# Patient Record
Sex: Female | Born: 1975 | Race: Black or African American | Hispanic: No | Smoking: Never smoker
Health system: Southern US, Community
[De-identification: ages and names within clinical notes are randomized; demographics above are authoritative.]

## PROBLEM LIST (undated history)

## (undated) DIAGNOSIS — K219 Gastro-esophageal reflux disease without esophagitis: Secondary | ICD-10-CM

## (undated) DIAGNOSIS — I10 Essential (primary) hypertension: Secondary | ICD-10-CM

## (undated) HISTORY — DX: Essential (primary) hypertension: I10

## (undated) HISTORY — PX: BACK SURGERY: SHX140

---

## 2009-02-08 HISTORY — PX: BREAST SURGERY: SHX581

## 2011-02-09 NOTE — L&D Delivery Note (Signed)
Arterial Cord Gas = 6.97

## 2011-07-17 ENCOUNTER — Inpatient Hospital Stay (HOSPITAL_COMMUNITY): Payer: Medicaid Other

## 2011-07-17 ENCOUNTER — Encounter (HOSPITAL_COMMUNITY): Payer: Self-pay | Admitting: *Deleted

## 2011-07-17 ENCOUNTER — Inpatient Hospital Stay (HOSPITAL_COMMUNITY)
Admission: AD | Admit: 2011-07-17 | Discharge: 2011-07-21 | DRG: 765 | Disposition: A | Discharge status: Home / Self Care | Payer: Medicaid Other | Source: Ambulatory Visit | Attending: Obstetrics & Gynecology | Admitting: Obstetrics & Gynecology

## 2011-07-17 DIAGNOSIS — IMO0002 Reserved for concepts with insufficient information to code with codable children: Secondary | ICD-10-CM

## 2011-07-17 DIAGNOSIS — O4290 Premature rupture of membranes, unspecified as to length of time between rupture and onset of labor, unspecified weeks of gestation: Secondary | ICD-10-CM | POA: Diagnosis present

## 2011-07-17 DIAGNOSIS — O42919 Preterm premature rupture of membranes, unspecified as to length of time between rupture and onset of labor, unspecified trimester: Secondary | ICD-10-CM | POA: Diagnosis present

## 2011-07-17 DIAGNOSIS — Z98891 History of uterine scar from previous surgery: Secondary | ICD-10-CM

## 2011-07-17 DIAGNOSIS — O321XX0 Maternal care for breech presentation, not applicable or unspecified: Secondary | ICD-10-CM | POA: Diagnosis present

## 2011-07-17 DIAGNOSIS — O09529 Supervision of elderly multigravida, unspecified trimester: Secondary | ICD-10-CM | POA: Diagnosis present

## 2011-07-17 DIAGNOSIS — O10019 Pre-existing essential hypertension complicating pregnancy, unspecified trimester: Secondary | ICD-10-CM | POA: Diagnosis present

## 2011-07-17 HISTORY — DX: Gastro-esophageal reflux disease without esophagitis: K21.9

## 2011-07-17 LAB — DIFFERENTIAL
Band Neutrophils: 4 % (ref 0–10)
Basophils Absolute: 0 10*3/uL (ref 0.0–0.1)
Basophils Relative: 0 % (ref 0–1)
Eosinophils Absolute: 0 10*3/uL (ref 0.0–0.7)
Eosinophils Relative: 0 % (ref 0–5)
Metamyelocytes Relative: 0 %
Myelocytes: 0 %
Neutro Abs: 14 10*3/uL — ABNORMAL HIGH (ref 1.7–7.7)
Promyelocytes Absolute: 0 %

## 2011-07-17 LAB — COMPREHENSIVE METABOLIC PANEL
Alkaline Phosphatase: 61 U/L (ref 39–117)
BUN: 5 mg/dL — ABNORMAL LOW (ref 6–23)
CO2: 22 mEq/L (ref 19–32)
Calcium: 8.7 mg/dL (ref 8.4–10.5)
GFR calc Af Amer: >90 mL/min (ref >90)
GFR calc non Af Amer: >90 mL/min (ref >90)
Glucose, Bld: 111 mg/dL — ABNORMAL HIGH (ref 70–99)
Total Protein: 6.8 g/dL (ref 6.0–8.3)

## 2011-07-17 LAB — TYPE AND SCREEN: Antibody Screen: NEGATIVE

## 2011-07-17 LAB — RPR: RPR Ser Ql: NONREACTIVE

## 2011-07-17 LAB — HEMOGLOBIN A1C
Hgb A1c MFr Bld: 5.9 % — ABNORMAL HIGH (ref <5.7)
Mean Plasma Glucose: 123 mg/dL — ABNORMAL HIGH (ref <117)

## 2011-07-17 LAB — RAPID URINE DRUG SCREEN, HOSP PERFORMED
Amphetamines: NOT DETECTED
Opiates: NOT DETECTED

## 2011-07-17 LAB — URINALYSIS, ROUTINE W REFLEX MICROSCOPIC
Leukocytes, UA: NEGATIVE
Protein, ur: 30 mg/dL — AB
Specific Gravity, Urine: 1.015 (ref 1.005–1.030)

## 2011-07-17 LAB — CBC
HCT: 36.7 % (ref 36.0–46.0)
Hemoglobin: 12.4 g/dL (ref 12.0–15.0)
MCH: 30.8 pg (ref 26.0–34.0)
MCHC: 33.8 g/dL (ref 30.0–36.0)
MCV: 91.3 fL (ref 78.0–100.0)
RBC: 4.02 MIL/uL (ref 3.87–5.11)

## 2011-07-17 LAB — RUBELLA SCREEN: Rubella: 158.9 IU/mL — ABNORMAL HIGH

## 2011-07-17 LAB — PROTEIN / CREATININE RATIO, URINE
Creatinine, Urine: 98.01 mg/dL
Protein Creatinine Ratio: 0.49 — ABNORMAL HIGH (ref 0.00–0.15)
Total Protein, Urine: 48.1 mg/dL

## 2011-07-17 LAB — WET PREP, GENITAL: Trich, Wet Prep: NONE SEEN

## 2011-07-17 LAB — HEPATITIS B SURFACE ANTIGEN: Hepatitis B Surface Ag: NEGATIVE

## 2011-07-17 LAB — OB RESULTS CONSOLE HIV ANTIBODY (ROUTINE TESTING): HIV: NONREACTIVE

## 2011-07-17 MED ORDER — FLUCONAZOLE 150 MG PO TABS
150.0000 mg | ORAL_TABLET | Freq: Once | ORAL | Status: AC
  Administered 2011-07-17: 150 mg via ORAL
  Filled 2011-07-17: qty 1

## 2011-07-17 MED ORDER — MAGNESIUM SULFATE BOLUS VIA INFUSION
6.0000 g | Freq: Once | INTRAVENOUS | Status: AC
  Administered 2011-07-17: 6 g via INTRAVENOUS
  Filled 2011-07-17: qty 500

## 2011-07-17 MED ORDER — ZOLPIDEM TARTRATE 10 MG PO TABS
10.0000 mg | ORAL_TABLET | Freq: Every evening | ORAL | Status: DC | PRN
  Administered 2011-07-18: 10 mg via ORAL
  Filled 2011-07-17: qty 1

## 2011-07-17 MED ORDER — BETAMETHASONE SOD PHOS & ACET 6 (3-3) MG/ML IJ SUSP
12.0000 mg | Freq: Every day | INTRAMUSCULAR | Status: DC
  Administered 2011-07-17: 12 mg via INTRAMUSCULAR
  Filled 2011-07-17 (×2): qty 2

## 2011-07-17 MED ORDER — DOCUSATE SODIUM 100 MG PO CAPS
100.0000 mg | ORAL_CAPSULE | Freq: Every day | ORAL | Status: DC
  Filled 2011-07-17 (×2): qty 1

## 2011-07-17 MED ORDER — LABETALOL HCL 5 MG/ML IV SOLN
20.0000 mg | INTRAVENOUS | Status: DC | PRN
  Filled 2011-07-17: qty 4

## 2011-07-17 MED ORDER — ACETAMINOPHEN 325 MG PO TABS: 650.0000 mg | ORAL_TABLET | ORAL | Status: DC | PRN

## 2011-07-17 MED ORDER — MAGNESIUM SULFATE 40 G IN LACTATED RINGERS - SIMPLE
3.0000 g/h | INTRAVENOUS | Status: DC
  Administered 2011-07-17: 2 g/h via INTRAVENOUS
  Administered 2011-07-18: 3 g/h via INTRAVENOUS
  Filled 2011-07-17 (×2): qty 500

## 2011-07-17 MED ORDER — CALCIUM CARBONATE ANTACID 500 MG PO CHEW
2.0000 | CHEWABLE_TABLET | ORAL | Status: DC | PRN
  Administered 2011-07-17: 200 mg via ORAL
  Filled 2011-07-17: qty 1
  Filled 2011-07-17: qty 2

## 2011-07-17 MED ORDER — PRENATAL MULTIVITAMIN CH
1.0000 | ORAL_TABLET | Freq: Every day | ORAL | Status: DC
  Filled 2011-07-17 (×2): qty 1

## 2011-07-17 MED ORDER — LABETALOL HCL 5 MG/ML IV SOLN
20.0000 mg | INTRAVENOUS | Status: DC | PRN
  Administered 2011-07-17: 20 mg via INTRAVENOUS
  Filled 2011-07-17: qty 4

## 2011-07-17 MED ORDER — LACTATED RINGERS IV SOLN
INTRAVENOUS | Status: DC
  Administered 2011-07-17 – 2011-07-18 (×3): via INTRAVENOUS

## 2011-07-17 MED ORDER — SODIUM CHLORIDE 0.9 % IV SOLN
2.0000 g | Freq: Four times a day (QID) | INTRAVENOUS | Status: DC
  Administered 2011-07-17 – 2011-07-18 (×4): 2 g via INTRAVENOUS
  Filled 2011-07-17 (×5): qty 2000

## 2011-07-17 MED ORDER — SODIUM CHLORIDE 0.9 % IV SOLN
250.0000 mg | Freq: Four times a day (QID) | INTRAVENOUS | Status: DC
  Administered 2011-07-17 – 2011-07-18 (×3): 250 mg via INTRAVENOUS
  Filled 2011-07-17 (×5): qty 250

## 2011-07-17 NOTE — Progress Notes (Signed)
BPP is 6/10 (-2 for fluid, -2 for breathing).  AFI 3.3. NST with baseline in 140s, moderate variablity, 10 x 10 accels, some variable decels. Reassuring for now.  Will repeat BPP as indicated.  Continue current management.  Jaynie Collins, M.D. 07/17/2011 5:02 PM

## 2011-07-17 NOTE — Progress Notes (Signed)
Pt gave me copy of prenatal labs from Mar-April 2013 done in [place].  Notified K. Clelia Croft, CNM of this

## 2011-07-17 NOTE — Progress Notes (Signed)
Spoke c Katie, Charity fundraiser (charge in NICU)  Requested Consult for sometime today.  Neonatologist is in delivery, but she will notify her.

## 2011-07-17 NOTE — MAU Note (Signed)
Water broke at 9am. Pt reports she had high blood pressure at the beginning and was on blood pressure medication  For one month and they told her to stop taking it because her b/p was normal again.

## 2011-07-17 NOTE — Progress Notes (Signed)
MgSO4 bolus completed pt tolerated well

## 2011-07-17 NOTE — H&P (Signed)
ANTENATAL ADMISSION HISTORY AND PHYSICAL  History of Present Illness: Pamela Salinas is a 36 y.o. G3P0020 at [redacted] weeks GA as per patient with unknown EDD admitted for PPROM and elevated BP.  Prenatal care in [place], arrived in the [place] end of April.  Encounter done with the help of a [place] interpreter.  Patient had gross rupture of membranes around 0900; copious amount of clear fluid. Denies bleeding, contractions.  Reports good fetal movement. Denies any fevers or recent infections, no abnormal vaginal discharge. Had intake visit on 07/16/11 [facility].  Patient is grossly ruptured on external evaluation.    Reports having elevated BP around February, was placed on medicine for a month, then discontinued because her BP normalized.  No history of chronic hypertension or other medical problems.  No other complications during this pregnancy.  Denies any headaches, vision changes, RUQ pain or other symptoms. In  Triage, BP was 200/107, then 180-190s/90s, and she received one dose of Labetalol.  BP now 134/81.    Patient Active Problem List  Diagnoses  . Hypertension in pregnancy, essential, antepartum  . Preterm premature rupture of membranes (PPROM) delivered, current hospitalization   Past Medical History: Past Medical History  Diagnosis Date  . GERD (gastroesophageal reflux disease)    Past Surgical History: Past Surgical History  Procedure Date  . Back surgery     cyst removal?   Obstetrical/Gynecologic History: OB History    Grav Para Term Preterm Abortions TAB SAB Ect Mult Living   3 0   2     0    SAB x 2, denies cervical dysplasia or STIs  Social History: History   Social History  . Marital Status: Unknown    Spouse Name: N/A    Number of Children: N/A  . Years of Education: N/A   Social History Main Topics  . Smoking status: Never Smoker   . Smokeless tobacco: Not on file  . Alcohol Use: No  . Drug Use: No  . Sexually Active: Yes    Birth Control/ Protection:  None   Other Topics Concern  . Not on file   Social History Narrative  . No narrative on file   Family History: No family history on file.  Allergies: No Known Allergies  Medications:  No prescriptions prior to admission   Review of Systems: Mentioned in HPI  Vitals:  Blood pressure 134/81, pulse 105, temperature 98 F (36.7 C), temperature source Oral, resp. rate 18. Physical Examination: General appearance - alert, well appearing, and in no distress Chest - clear to auscultation, no wheezes, rales or rhonchi, symmetric air entry Heart - tachycardic, regular rhythm Abdomen: gravid and non-tender  Pelvic Exam: Grossly ruptured, about 100 ml of clear fluid in vagina, cervix FT/75/-2 with bulging lower uterine segment.  Breech presentation on bedside ultrasound. Extremities: extremities normal, atraumatic, no cyanosis or edema and Homans sign is negative, no sign of DVT with DTRs 2+ on the bilateral and one beat of clonus Membranes:intact, ruptured, clear fluid Fetal Monitoring:Baseline: 140 bpm, Variability: moderate, Accelerations: Non-reactive but appropriate for gestational age and Decelerations: Variable: moderate Labs:  No results found for this or any previous visit (from the past 24 hour(s)).  Laboratory Studies: Results for orders placed during the hospital encounter of 07/17/11 (from the past 24 hour(s))  COMPREHENSIVE METABOLIC PANEL     Status: Abnormal   Collection Time   07/17/11 11:56 AM      Component Value Range   Sodium 136  135 -  145 (mEq/L)   Potassium 3.0 (*) 3.5 - 5.1 (mEq/L)   Chloride 103  96 - 112 (mEq/L)   CO2 22  19 - 32 (mEq/L)   Glucose, Bld 111 (*) 70 - 99 (mg/dL)   BUN 5 (*) 6 - 23 (mg/dL)   Creatinine, Ser 7.25 (*) 0.50 - 1.10 (mg/dL)   Calcium 8.7  8.4 - 36.6 (mg/dL)   Total Protein 6.8  6.0 - 8.3 (g/dL)   Albumin 2.9 (*) 3.5 - 5.2 (g/dL)   AST 22  0 - 37 (U/L)   ALT 5  0 - 35 (U/L)   Alkaline Phosphatase 61  39 - 117 (U/L)   Total  Bilirubin 0.2 (*) 0.3 - 1.2 (mg/dL)   GFR calc non Af Amer >90  >90 (mL/min)   GFR calc Af Amer >90  >90 (mL/min)  RAPID HIV SCREEN [facility])     Status: Normal   Collection Time   07/17/11 11:56 AM      Component Value Range   SUDS Rapid HIV Screen NON REACTIVE  NON REACTIVE   TYPE AND SCREEN     Status: Normal   Collection Time   07/17/11 11:56 AM      Component Value Range   ABO/RH(D) B POS     Antibody Screen NEG     Sample Expiration 07/20/2011    CBC     Status: Abnormal   Collection Time   07/17/11 11:56 AM      Component Value Range   WBC 17.3 (*) 4.0 - 10.5 (K/uL)   RBC 4.02  3.87 - 5.11 (MIL/uL)   Hemoglobin 12.4  12.0 - 15.0 (g/dL)   HCT 44.0  34.7 - 42.5 (%)   MCV 91.3  78.0 - 100.0 (fL)   MCH 30.8  26.0 - 34.0 (pg)   MCHC 33.8  30.0 - 36.0 (g/dL)   RDW 95.6  38.7 - 56.4 (%)   Platelets 313  150 - 400 (K/uL)  DIFFERENTIAL     Status: Abnormal   Collection Time   07/17/11 11:56 AM      Component Value Range   Neutrophils Relative 77  43 - 77 (%)   Lymphocytes Relative 14  12 - 46 (%)   Monocytes Relative 5  3 - 12 (%)   Eosinophils Relative 0  0 - 5 (%)   Basophils Relative 0  0 - 1 (%)   Band Neutrophils 4  0 - 10 (%)   Metamyelocytes Relative 0     Myelocytes 0     Promyelocytes Absolute 0     Blasts 0     nRBC 0  0 (/100 WBC)   Neutro Abs 14.0 (*) 1.7 - 7.7 (K/uL)   Lymphs Abs 2.4  0.7 - 4.0 (K/uL)   Monocytes Absolute 0.9  0.1 - 1.0 (K/uL)   Eosinophils Absolute 0.0  0.0 - 0.7 (K/uL)   Basophils Absolute 0.0  0.0 - 0.1 (K/uL)   Smear Review LARGE PLATELETS PRESENT    URINALYSIS, ROUTINE W REFLEX MICROSCOPIC     Status: Abnormal   Collection Time   07/17/11 12:15 PM      Component Value Range   Color, Urine YELLOW  YELLOW    APPearance CLEAR  CLEAR    Specific Gravity, Urine 1.015  1.005 - 1.030    pH 7.0  5.0 - 8.0    Glucose, UA NEGATIVE  NEGATIVE (mg/dL)   Hgb urine dipstick LARGE (*) NEGATIVE  Bilirubin Urine NEGATIVE  NEGATIVE    Ketones, ur 15  (*) NEGATIVE (mg/dL)   Protein, ur 30 (*) NEGATIVE (mg/dL)   Urobilinogen, UA 0.2  0.0 - 1.0 (mg/dL)   Nitrite NEGATIVE  NEGATIVE    Leukocytes, UA NEGATIVE  NEGATIVE   URINE MICROSCOPIC-ADD ON     Status: Normal   Collection Time   07/17/11 12:15 PM      Component Value Range   Squamous Epithelial / LPF RARE  RARE    WBC, UA 0-2  <3 (WBC/hpf)   RBC / HPF 7-10  <3 (RBC/hpf)   Bacteria, UA RARE  RARE   WET PREP, GENITAL     Status: Abnormal   Collection Time   07/17/11 12:18 PM      Component Value Range   Yeast Wet Prep HPF POC MODERATE (*) NONE SEEN    Trich, Wet Prep NONE SEEN  NONE SEEN    Clue Cells Wet Prep HPF POC NONE SEEN  NONE SEEN    WBC, Wet Prep HPF POC FEW (*) NONE SEEN     ASSESSMENT AND PLAN: Patient Active Problem List  Diagnoses  . Hypertension in pregnancy, essential, antepartum  . Preterm premature rupture of membranes (PPROM) delivered, current hospitalization  Will admit to Antenatal Magnesium sulfate for neuroprotection and eclampsia prophylaxis Betamethasone regimen.  Will consider doing 1 hr GTT in 1-2 weeks if still pregnant. Antihypertensives as needed, will follow up 24 hour urine protein analysis and other labs. OB ultrasound ordered NICU and Anesthesiology consults ordered Bedrest with bathroom privileges; SCDs while in bed NST BID, tocometry as needed Latency antibiotics ordered; also Diflucan given for yeast infection. Delivery indicated for uncontrolled BP, fetal distress, any signs of chorioamnionitis or other maternal-fetal indication Routine antenatal care  Jaynie Collins, M.D. 07/17/2011 1:56 PM

## 2011-07-17 NOTE — Progress Notes (Signed)
US tech here for BPP

## 2011-07-17 NOTE — Progress Notes (Signed)
Reviewing Admission Data & POC c pt, using [employer] [place]).  Interpreter # [id number].

## 2011-07-18 ENCOUNTER — Inpatient Hospital Stay (HOSPITAL_COMMUNITY): Payer: Medicaid Other | Admitting: Anesthesiology

## 2011-07-18 ENCOUNTER — Encounter (HOSPITAL_COMMUNITY): Payer: Self-pay | Admitting: Anesthesiology

## 2011-07-18 ENCOUNTER — Encounter (HOSPITAL_COMMUNITY): Payer: Self-pay

## 2011-07-18 ENCOUNTER — Inpatient Hospital Stay (HOSPITAL_COMMUNITY): Payer: Medicaid Other

## 2011-07-18 ENCOUNTER — Encounter (HOSPITAL_COMMUNITY)
Admission: AD | Disposition: A | Discharge status: Home / Self Care | Payer: Self-pay | Source: Ambulatory Visit | Attending: Obstetrics & Gynecology

## 2011-07-18 DIAGNOSIS — Z98891 History of uterine scar from previous surgery: Secondary | ICD-10-CM

## 2011-07-18 LAB — MRSA PCR SCREENING: MRSA by PCR: NEGATIVE

## 2011-07-18 SURGERY — Surgical Case
Anesthesia: Regional | Site: Abdomen | Wound class: Clean Contaminated

## 2011-07-18 MED ORDER — SODIUM CHLORIDE 0.9 % IV SOLN
1.0000 ug/kg/h | INTRAVENOUS | Status: DC | PRN
  Filled 2011-07-18: qty 2.5

## 2011-07-18 MED ORDER — SIMETHICONE 80 MG PO CHEW
80.0000 mg | CHEWABLE_TABLET | ORAL | Status: DC | PRN
  Administered 2011-07-18 – 2011-07-19 (×2): 80 mg via ORAL

## 2011-07-18 MED ORDER — DIPHENHYDRAMINE HCL 50 MG/ML IJ SOLN: 12.5000 mg | INTRAMUSCULAR | Status: DC | PRN

## 2011-07-18 MED ORDER — PROMETHAZINE HCL 25 MG/ML IJ SOLN: 6.2500 mg | INTRAMUSCULAR | Status: DC | PRN

## 2011-07-18 MED ORDER — KETOROLAC TROMETHAMINE 30 MG/ML IJ SOLN: 30.0000 mg | Freq: Four times a day (QID) | INTRAMUSCULAR | Status: DC | PRN

## 2011-07-18 MED ORDER — DIPHENHYDRAMINE HCL 25 MG PO CAPS: 25.0000 mg | ORAL_CAPSULE | ORAL | Status: DC | PRN

## 2011-07-18 MED ORDER — ACETAMINOPHEN 325 MG PO TABS: 325.0000 mg | ORAL_TABLET | ORAL | Status: DC | PRN

## 2011-07-18 MED ORDER — MEPERIDINE HCL 25 MG/ML IJ SOLN: 6.2500 mg | INTRAMUSCULAR | Status: DC | PRN

## 2011-07-18 MED ORDER — NALBUPHINE HCL 10 MG/ML IJ SOLN
5.0000 mg | INTRAMUSCULAR | Status: DC | PRN
  Filled 2011-07-18: qty 1

## 2011-07-18 MED ORDER — ONDANSETRON HCL 4 MG PO TABS: 4.0000 mg | ORAL_TABLET | ORAL | Status: DC | PRN

## 2011-07-18 MED ORDER — FENTANYL CITRATE 0.05 MG/ML IJ SOLN: 25.0000 ug | INTRAMUSCULAR | Status: DC | PRN

## 2011-07-18 MED ORDER — ACETAMINOPHEN 10 MG/ML IV SOLN
1000.0000 mg | Freq: Four times a day (QID) | INTRAVENOUS | Status: DC | PRN
  Filled 2011-07-18: qty 100

## 2011-07-18 MED ORDER — ONDANSETRON HCL 4 MG/2ML IJ SOLN: 4.0000 mg | Freq: Three times a day (TID) | INTRAMUSCULAR | Status: DC | PRN

## 2011-07-18 MED ORDER — SCOPOLAMINE 1 MG/3DAYS TD PT72
1.0000 | MEDICATED_PATCH | Freq: Once | TRANSDERMAL | Status: AC
  Administered 2011-07-18: 1.5 mg via TRANSDERMAL

## 2011-07-18 MED ORDER — SODIUM CHLORIDE 0.9 % IV SOLN: 250.0000 mL | INTRAVENOUS | Status: DC

## 2011-07-18 MED ORDER — TETANUS-DIPHTH-ACELL PERTUSSIS 5-2.5-18.5 LF-MCG/0.5 IM SUSP
0.5000 mL | Freq: Once | INTRAMUSCULAR | Status: AC
  Administered 2011-07-19: 0.5 mL via INTRAMUSCULAR
  Filled 2011-07-18: qty 0.5

## 2011-07-18 MED ORDER — IBUPROFEN 600 MG PO TABS
600.0000 mg | ORAL_TABLET | Freq: Four times a day (QID) | ORAL | Status: DC
  Administered 2011-07-18 – 2011-07-21 (×10): 600 mg via ORAL
  Filled 2011-07-18 (×11): qty 1

## 2011-07-18 MED ORDER — SODIUM CHLORIDE 0.9 % IJ SOLN: 3.0000 mL | INTRAMUSCULAR | Status: DC | PRN

## 2011-07-18 MED ORDER — MORPHINE SULFATE 0.5 MG/ML IJ SOLN
INTRAMUSCULAR | Status: AC
  Filled 2011-07-18: qty 10

## 2011-07-18 MED ORDER — LANOLIN HYDROUS EX OINT: 1.0000 "application " | TOPICAL_OINTMENT | CUTANEOUS | Status: DC | PRN

## 2011-07-18 MED ORDER — PHENYLEPHRINE 40 MCG/ML (10ML) SYRINGE FOR IV PUSH (FOR BLOOD PRESSURE SUPPORT)
PREFILLED_SYRINGE | INTRAVENOUS | Status: AC
  Filled 2011-07-18: qty 20

## 2011-07-18 MED ORDER — MORPHINE SULFATE (PF) 0.5 MG/ML IJ SOLN
INTRAMUSCULAR | Status: DC | PRN
  Administered 2011-07-18: .1 mg via INTRATHECAL

## 2011-07-18 MED ORDER — MIDAZOLAM HCL 2 MG/2ML IJ SOLN: 0.5000 mg | Freq: Once | INTRAMUSCULAR | Status: DC | PRN

## 2011-07-18 MED ORDER — OXYTOCIN 10 UNIT/ML IJ SOLN
INTRAMUSCULAR | Status: AC
  Filled 2011-07-18: qty 4

## 2011-07-18 MED ORDER — SODIUM CHLORIDE 0.9 % IR SOLN
Status: DC | PRN
  Administered 2011-07-18: 1000 mL

## 2011-07-18 MED ORDER — OXYCODONE-ACETAMINOPHEN 5-325 MG PO TABS
1.0000 | ORAL_TABLET | ORAL | Status: DC | PRN
  Administered 2011-07-18: 2 via ORAL
  Administered 2011-07-19 – 2011-07-21 (×7): 1 via ORAL
  Filled 2011-07-18: qty 1
  Filled 2011-07-18: qty 2
  Filled 2011-07-18 (×6): qty 1

## 2011-07-18 MED ORDER — EPHEDRINE 5 MG/ML INJ
INTRAVENOUS | Status: AC
  Filled 2011-07-18: qty 10

## 2011-07-18 MED ORDER — MENTHOL 3 MG MT LOZG: 1.0000 | LOZENGE | OROMUCOSAL | Status: DC | PRN

## 2011-07-18 MED ORDER — OXYTOCIN 20 UNITS IN LACTATED RINGERS INFUSION - SIMPLE
INTRAVENOUS | Status: DC | PRN
  Administered 2011-07-18 (×2): 20 [IU] via INTRAVENOUS

## 2011-07-18 MED ORDER — FENTANYL CITRATE 0.05 MG/ML IJ SOLN
INTRAMUSCULAR | Status: AC
  Filled 2011-07-18: qty 2

## 2011-07-18 MED ORDER — OXYCODONE-ACETAMINOPHEN 5-325 MG PO TABS
2.0000 | ORAL_TABLET | Freq: Once | ORAL | Status: AC
  Administered 2011-07-18: 2 via ORAL
  Filled 2011-07-18: qty 2

## 2011-07-18 MED ORDER — ONDANSETRON HCL 4 MG/2ML IJ SOLN
INTRAMUSCULAR | Status: DC | PRN
  Administered 2011-07-18: 4 mg via INTRAVENOUS

## 2011-07-18 MED ORDER — ONDANSETRON HCL 4 MG/2ML IJ SOLN
INTRAMUSCULAR | Status: AC
  Filled 2011-07-18: qty 2

## 2011-07-18 MED ORDER — DIBUCAINE 1 % RE OINT: 1.0000 "application " | TOPICAL_OINTMENT | RECTAL | Status: DC | PRN

## 2011-07-18 MED ORDER — SCOPOLAMINE 1 MG/3DAYS TD PT72
MEDICATED_PATCH | TRANSDERMAL | Status: AC
  Filled 2011-07-18: qty 1

## 2011-07-18 MED ORDER — KETOROLAC TROMETHAMINE 30 MG/ML IJ SOLN
INTRAMUSCULAR | Status: AC
  Administered 2011-07-18: 30 mg via INTRAVENOUS
  Filled 2011-07-18: qty 1

## 2011-07-18 MED ORDER — OXYTOCIN 20 UNITS IN LACTATED RINGERS INFUSION - SIMPLE
125.0000 mL/h | INTRAVENOUS | Status: AC
  Administered 2011-07-18: 100 mL/h via INTRAVENOUS
  Filled 2011-07-18 (×2): qty 1000

## 2011-07-18 MED ORDER — DIPHENHYDRAMINE HCL 50 MG/ML IJ SOLN: 25.0000 mg | INTRAMUSCULAR | Status: DC | PRN

## 2011-07-18 MED ORDER — EPHEDRINE SULFATE 50 MG/ML IJ SOLN
INTRAMUSCULAR | Status: DC | PRN
  Administered 2011-07-18 (×2): 10 mg via INTRAVENOUS
  Administered 2011-07-18: 5 mg via INTRAVENOUS

## 2011-07-18 MED ORDER — DIPHENHYDRAMINE HCL 25 MG PO CAPS: 25.0000 mg | ORAL_CAPSULE | Freq: Four times a day (QID) | ORAL | Status: DC | PRN

## 2011-07-18 MED ORDER — NALOXONE HCL 0.4 MG/ML IJ SOLN: 0.4000 mg | INTRAMUSCULAR | Status: DC | PRN

## 2011-07-18 MED ORDER — PRENATAL MULTIVITAMIN CH
1.0000 | ORAL_TABLET | Freq: Every day | ORAL | Status: DC
  Administered 2011-07-19 – 2011-07-21 (×3): 1 via ORAL
  Filled 2011-07-18 (×3): qty 1

## 2011-07-18 MED ORDER — METOCLOPRAMIDE HCL 5 MG/ML IJ SOLN: 10.0000 mg | Freq: Three times a day (TID) | INTRAMUSCULAR | Status: DC | PRN

## 2011-07-18 MED ORDER — PROPOFOL 10 MG/ML IV EMUL
INTRAVENOUS | Status: AC
  Filled 2011-07-18: qty 20

## 2011-07-18 MED ORDER — MAGNESIUM HYDROXIDE 400 MG/5ML PO SUSP
30.0000 mL | ORAL | Status: DC | PRN
  Filled 2011-07-18: qty 30

## 2011-07-18 MED ORDER — ZOLPIDEM TARTRATE 5 MG PO TABS: 5.0000 mg | ORAL_TABLET | Freq: Every evening | ORAL | Status: DC | PRN

## 2011-07-18 MED ORDER — CITRIC ACID-SODIUM CITRATE 334-500 MG/5ML PO SOLN
ORAL | Status: AC
  Administered 2011-07-18: 30 mL
  Filled 2011-07-18: qty 15

## 2011-07-18 MED ORDER — KETOROLAC TROMETHAMINE 30 MG/ML IJ SOLN
30.0000 mg | Freq: Four times a day (QID) | INTRAMUSCULAR | Status: DC | PRN
  Administered 2011-07-18: 30 mg via INTRAVENOUS

## 2011-07-18 MED ORDER — BUPIVACAINE IN DEXTROSE 0.75-8.25 % IT SOLN
INTRATHECAL | Status: DC | PRN
  Administered 2011-07-18: 12 mg via INTRATHECAL

## 2011-07-18 MED ORDER — FENTANYL CITRATE 0.05 MG/ML IJ SOLN
INTRAMUSCULAR | Status: DC | PRN
  Administered 2011-07-18: 25 ug via INTRATHECAL

## 2011-07-18 MED ORDER — ONDANSETRON HCL 4 MG/2ML IJ SOLN: 4.0000 mg | INTRAMUSCULAR | Status: DC | PRN

## 2011-07-18 MED ORDER — WITCH HAZEL-GLYCERIN EX PADS: 1.0000 "application " | MEDICATED_PAD | CUTANEOUS | Status: DC | PRN

## 2011-07-18 MED ORDER — PHENYLEPHRINE HCL 10 MG/ML IJ SOLN
INTRAMUSCULAR | Status: DC | PRN
  Administered 2011-07-18 (×5): 40 ug via INTRAVENOUS
  Administered 2011-07-18 (×2): 80 ug via INTRAVENOUS
  Administered 2011-07-18 (×2): 40 ug via INTRAVENOUS
  Administered 2011-07-18 (×2): 80 ug via INTRAVENOUS
  Administered 2011-07-18 (×2): 40 ug via INTRAVENOUS

## 2011-07-18 MED ORDER — SENNOSIDES-DOCUSATE SODIUM 8.6-50 MG PO TABS
2.0000 | ORAL_TABLET | Freq: Every day | ORAL | Status: DC
  Administered 2011-07-18 – 2011-07-20 (×3): 2 via ORAL

## 2011-07-18 MED ORDER — SODIUM CHLORIDE 0.9 % IJ SOLN
3.0000 mL | Freq: Two times a day (BID) | INTRAMUSCULAR | Status: DC
  Administered 2011-07-18 – 2011-07-19 (×2): 3 mL via INTRAVENOUS

## 2011-07-18 MED ORDER — MAGNESIUM SULFATE 40 G IN LACTATED RINGERS - SIMPLE
2.0000 g/h | INTRAVENOUS | Status: DC
  Administered 2011-07-18: 2 g/h via INTRAVENOUS
  Filled 2011-07-18: qty 500

## 2011-07-18 SURGICAL SUPPLY — 34 items
CHLORAPREP W/TINT 26ML (MISCELLANEOUS) IMPLANT
CLOTH BEACON ORANGE TIMEOUT ST (SAFETY) ×2 IMPLANT
DRESSING TELFA 8X3 (GAUZE/BANDAGES/DRESSINGS) IMPLANT
DRSG COVADERM 4X10 (GAUZE/BANDAGES/DRESSINGS) ×2 IMPLANT
ELECT REM PT RETURN 9FT ADLT (ELECTROSURGICAL) ×2
ELECTRODE REM PT RTRN 9FT ADLT (ELECTROSURGICAL) ×1 IMPLANT
EXTRACTOR VACUUM M CUP 4 TUBE (SUCTIONS) IMPLANT
GAUZE SPONGE 4X4 12PLY STRL LF (GAUZE/BANDAGES/DRESSINGS) IMPLANT
GLOVE BIO SURGEON STRL SZ7 (GLOVE) ×2 IMPLANT
GLOVE BIOGEL PI IND STRL 7.0 (GLOVE) ×2 IMPLANT
GLOVE BIOGEL PI INDICATOR 7.0 (GLOVE) ×2
GOWN PREVENTION PLUS LG XLONG (DISPOSABLE) ×6 IMPLANT
KIT ABG SYR 3ML LUER SLIP (SYRINGE) ×2 IMPLANT
NEEDLE HYPO 22GX1.5 SAFETY (NEEDLE) IMPLANT
NEEDLE HYPO 25X5/8 SAFETYGLIDE (NEEDLE) ×2 IMPLANT
NS IRRIG 1000ML POUR BTL (IV SOLUTION) ×2 IMPLANT
PACK C SECTION WH (CUSTOM PROCEDURE TRAY) ×2 IMPLANT
PAD ABD 7.5X8 STRL (GAUZE/BANDAGES/DRESSINGS) ×4 IMPLANT
RTRCTR C-SECT PINK 25CM LRG (MISCELLANEOUS) ×2 IMPLANT
SLEEVE SCD COMPRESS KNEE LRG (MISCELLANEOUS) IMPLANT
SLEEVE SCD COMPRESS KNEE MED (MISCELLANEOUS) ×2 IMPLANT
STAPLER VISISTAT 35W (STAPLE) IMPLANT
SUT MNCRL 0 VIOLET CTX 36 (SUTURE) ×2 IMPLANT
SUT MONOCRYL 0 CTX 36 (SUTURE) ×2
SUT PDS AB 0 CT1 27 (SUTURE) IMPLANT
SUT VIC AB 0 CT1 36 (SUTURE) ×4 IMPLANT
SUT VIC AB 2-0 CT1 27 (SUTURE)
SUT VIC AB 2-0 CT1 TAPERPNT 27 (SUTURE) IMPLANT
SUT VIC AB 4-0 KS 27 (SUTURE) ×2 IMPLANT
SYR CONTROL 10ML LL (SYRINGE) IMPLANT
TAPE CLOTH SURG 4X10 WHT LF (GAUZE/BANDAGES/DRESSINGS) ×2 IMPLANT
TOWEL OR 17X24 6PK STRL BLUE (TOWEL DISPOSABLE) ×4 IMPLANT
TRAY FOLEY CATH 14FR (SET/KITS/TRAYS/PACK) IMPLANT
WATER STERILE IRR 1000ML POUR (IV SOLUTION) ×2 IMPLANT

## 2011-07-18 NOTE — Progress Notes (Signed)
Dr. Macon Large notified of pt status, FHR tracing and interventions uc pattern, and request for medication for pain, orders received

## 2011-07-18 NOTE — Anesthesia Procedure Notes (Signed)
Spinal  Patient location during procedure: OR Start time: 07/18/2011 8:16 AM Staffing Anesthesiologist: Brayton Caves R Performed by: anesthesiologist  Preanesthetic Checklist Completed: patient identified, site marked, surgical consent, pre-op evaluation, timeout performed, IV checked, risks and benefits discussed and monitors and equipment checked Spinal Block Patient position: sitting Prep: DuraPrep Patient monitoring: heart rate, cardiac monitor, continuous pulse ox and blood pressure Approach: midline Location: L3-4 Injection technique: single-shot Needle Needle type: Sprotte  Needle gauge: 24 G Needle length: 9 cm Assessment Sensory level: T4 Additional Notes Risk benefits discussed with translator. Lateral placement.  No complications.

## 2011-07-18 NOTE — Progress Notes (Signed)
Pincus Badder CNM notified of FHR and decelerations. Will continue to monitor.

## 2011-07-18 NOTE — Transfer of Care (Signed)
Immediate Anesthesia Transfer of Care Note  Patient: Pamela Salinas  Procedure(s) Performed: Procedure(s) (LRB): CESAREAN SECTION (N/A)  Patient Location: PACU  Anesthesia Type: Spinal  Level of Consciousness: awake, alert  and oriented  Airway & Oxygen Therapy: Patient Spontanous Breathing  Post-op Assessment: Report given to PACU RN and Post -op Vital signs reviewed and stable  Post vital signs: stable  Complications: No apparent anesthesia complications

## 2011-07-18 NOTE — Progress Notes (Addendum)
LATE ENTRY for [id number]  -- SVE by Dr Macon Large @ [phone].  Found fetal legs & Umbilical Cord in vagina.  - Called for additional RN assistance, Notifed OR, NICU & Anesthesia.  [employer] ([place]) used to explain situation to pt & obtain consent.  Dr Macon Large maintained her position while prepped for OR, then switched with Sandrea Hughs, RNC so she could scrub.   Accompanied to OR by pt's cousin. 56 -- IN OR.  Report given to CRNA & Circulator.  Used [employer] for assistance during spinal & surgery. [id number]-  Pt care turned over to [employer], RN 0900-  24 urine taken to PACU for cont'd collection.

## 2011-07-18 NOTE — Transfer of Care (Signed)
Immediate Anesthesia Transfer of Care Note  Patient: Pamela Salinas  Procedure(s) Performed: * No procedures listed *  Patient Location: PACU  Anesthesia Type: Spinal  Level of Consciousness: awake, alert  and oriented  Airway & Oxygen Therapy: Patient Spontanous Breathing  Post-op Assessment: Report given to PACU RN and Post -op Vital signs reviewed and stable  Post vital signs: stable  Complications: No apparent anesthesia complications

## 2011-07-18 NOTE — Op Note (Signed)
[name] PROCEDURE DATE: 07/17/2011 - 07/18/2011  PREOPERATIVE DIAGNOSIS: Intrauterine pregnancy at  [redacted]w[redacted]d weeks gestation; preeclampsia; PPROM; breech presentation; cord prolapse  POSTOPERATIVE DIAGNOSIS: The same  PROCEDURE: Primary Low Transverse Cesarean Section  SURGEON:  Dr. Jaynie Collins  ANESTHESIOLOGIST: Dr. Brayton Caves  INDICATIONS: Pamela Salinas is a 36 y.o. [id number] at [redacted]w[redacted]d here for cesarean section secondary to cord prolapse in the setting of known PPROM and preeclampsia.  The risks of cesarean section were discussed with the patient including but were not limited to: bleeding which may require transfusion or reoperation; infection which may require antibiotics; injury to bowel, bladder, ureters or other surrounding organs; injury to the fetus; need for additional procedures including hysterectomy in the event of a life-threatening hemorrhage; placental abnormalities wth subsequent pregnancies, incisional problems, thromboembolic phenomenon and other postoperative/anesthesia complications.   The patient concurred with the proposed plan, giving informed written consent for the procedure.    FINDINGS:  Viable female infant in breech presentation.  Apgars 4 and 7, arterial cord pH 6.97 as per RN report, weight 2 pounds and 11.9 ounces.  Clear amniotic fluid.  Intact placenta, three vessel cord.  Normal uterus, fallopian tubes and ovaries bilaterally.  ANESTHESIA: Spinal INTRAVENOUS FLUIDS: 1300 ml ESTIMATED BLOOD LOSS: 400 ml URINE OUTPUT:  50 ml SPECIMENS: Placenta sent to pathology COMPLICATIONS: None immediate  PROCEDURE IN DETAIL:  The patient preoperatively received intravenous antibiotics and had sequential compression devices applied to her lower extremities.  She was then taken to the operating room where spinal anesthesia was administered and was found to be adequate. She was then placed in a dorsal supine position with a leftward tilt, and prepped and draped in a  sterile manner.  She already had a foley catheter placed into her bladder and attached to constant gravity.  After an adequate timeout was performed, a Pfannenstiel skin incision was made with scalpel and carried through to the underlying layer of fascia. The fascia was incised in the midline, and this incision was extended bilaterally bluntly.  The rectus muscles were dissected off bluntly and separated in the midline bluntly and the peritoneum was entered bluntly. Attention was turned to the lower uterine segment where a low transverse hysterotomy was made with a scalpel and extended bilaterally bluntly.  The infant was successfully delivered, the cord was clamped and cut and the infant was handed over to awaiting neonatology team. Uterine massage was then administered, and the placenta delivered intact with a three-vessel cord. The uterus was then cleared of clot and debris.  The hysterotomy was closed with 0 Monocryl in a running locked fashion, and an imbricating layer was also placed with a 0 Monocryl suture. The pelvis was cleared of all clot and debris. Hemostasis was confirmed on all surfaces.  The peritoneum and the muscles were reapproximated using 0 Monocryl interrupted stitches. The fascia was then closed using 0 Vicryl  in a running fashion.  The subcutaneous layer was irrigated, then reapproximated with 0 Vicryl interrupted stitches, and the skin was closed with a 4-0 Vicryl subcuticular stitch. The patient tolerated the procedure well. Sponge, lap, instrument and needle counts were correct x 2.  She was taken to the recovery room in stable condition.

## 2011-07-18 NOTE — OR Nursing (Signed)
Uterus massaged by S. Ra Pfiester Charity fundraiser. Two tubes of cord blood sent to lab. Foley catheter in upon arrival to OR. Urine color-yellow.

## 2011-07-18 NOTE — Interval H&P Note (Signed)
History and Physical Interval Note:  Patient diagnosed with umbilical cord prolapse on exam after having increasing contractions, fetal bottom and legs in the vagina. FHR in the 130s, minimal variability, variable decels. Presenting part lifted up off the cord, urgent cesarean section called.  Anesthesia and OR aware.  Risks of surgery reviewed with help of [facility] interpreter.  To OR now.  Jaynie Collins, M.D. 07/18/2011 8:02 AM

## 2011-07-18 NOTE — Anesthesia Preprocedure Evaluation (Signed)
Anesthesia Evaluation  Patient identified by MRN, date of birth, ID band Patient awake    Reviewed: Allergy & Precautions, H&P , NPO status , Patient's Chart, lab work & pertinent test results  Airway Mallampati: III      Dental No notable dental hx.    Pulmonary neg pulmonary ROS,  breath sounds clear to auscultation  Pulmonary exam normal       Cardiovascular Exercise Tolerance: Good hypertension, negative cardio ROS  Rhythm:regular Rate:Normal     Neuro/Psych negative neurological ROS  negative psych ROS   GI/Hepatic negative GI ROS, Neg liver ROS, GERD-  ,  Endo/Other  negative endocrine ROS  Renal/GU negative Renal ROS  negative genitourinary   Musculoskeletal   Abdominal Normal abdominal exam  (+)   Peds  Hematology negative hematology ROS (+)   Anesthesia Other Findings   Reproductive/Obstetrics (+) Pregnancy                           Anesthesia Physical Anesthesia Plan  ASA: III and Emergent  Anesthesia Plan: Spinal   Post-op Pain Management:    Induction:   Airway Management Planned:   Additional Equipment:   Intra-op Plan:   Post-operative Plan:   Informed Consent: I have reviewed the patients History and Physical, chart, labs and discussed the procedure including the risks, benefits and alternatives for the proposed anesthesia with the patient or authorized representative who has indicated his/her understanding and acceptance.     Plan Discussed with: Anesthesiologist, CRNA and Surgeon  Anesthesia Plan Comments:         Anesthesia Quick Evaluation

## 2011-07-18 NOTE — Progress Notes (Signed)
Dr. Arby Barrette in department notified of pt status an dconsult ordered

## 2011-07-18 NOTE — Anesthesia Postprocedure Evaluation (Signed)
Anesthesia Post Note  Patient: Pamela Salinas  Procedure(s) Performed: Procedure(s) (LRB): CESAREAN SECTION (N/A)  Anesthesia type: Spinal  Patient location: PACU  Post pain: Pain level controlled  Post assessment: Post-op Vital signs reviewed  Last Vitals:  Filed Vitals:   07/18/11 1015  BP: 130/77  Pulse: 107  Temp:   Resp: 24    Post vital signs: Reviewed  Level of consciousness: awake  Complications: No apparent anesthesia complications

## 2011-07-19 ENCOUNTER — Encounter (HOSPITAL_COMMUNITY): Payer: Self-pay | Admitting: *Deleted

## 2011-07-19 LAB — CBC
Platelets: 306 10*3/uL (ref 150–400)
RBC: 3.77 MIL/uL — ABNORMAL LOW (ref 3.87–5.11)
WBC: 19.1 10*3/uL — ABNORMAL HIGH (ref 4.0–10.5)

## 2011-07-19 MED ORDER — MAGNESIUM SULFATE 40 G IN LACTATED RINGERS - SIMPLE: 2.0000 g/h | INTRAVENOUS | Status: AC

## 2011-07-19 MED ORDER — LACTATED RINGERS IV SOLN
INTRAVENOUS | Status: DC
  Administered 2011-07-19: 100 mL/h via INTRAVENOUS

## 2011-07-19 NOTE — Progress Notes (Signed)
Fundus firm at U/2 with scant lochia.  Negative Homans sign. Encouraged ambulation and increased activity with assist. Hourly incentive spirometry also encouraged.

## 2011-07-19 NOTE — Progress Notes (Addendum)
Subjective: Postpartum Day 2: Cesarean Delivery Patient reports incisional pain and tolerating PO.    Objective: Vital signs in last 24 hours: Temp:  [97.6 F (36.4 C)-99.2 F (37.3 C)] 98.2 F (36.8 C) (06/10 0400) Pulse Rate:  [88-125] 94  (06/10 0700) Resp:  [16-34] 18  (06/10 0600) BP: (109-145)/(56-88) 126/68 mmHg (06/10 0700) SpO2:  [94 %-100 %] 97 % (06/10 0700) Weight:  [109.77 kg (242 lb)-112.22 kg (247 lb 6.4 oz)] 112.22 kg (247 lb 6.4 oz) (06/10 0600)  Physical Exam:  General: alert, cooperative and no distress Lochia: appropriate Uterine Fundus: firm Incision: healing well, dry dressing DVT Evaluation: No evidence of DVT seen on physical exam.   Basename 07/19/11 0508 07/17/11 1156  HGB 11.5* 12.4  HCT 34.9* 36.7   Recent Results (from the past 24 hour(s))  MRSA PCR SCREENING   Collection Time   07/18/11 11:20 AM      Component Value Range   MRSA by PCR NEGATIVE  NEGATIVE   CBC   Collection Time   07/19/11  5:08 AM      Component Value Range   WBC 19.1 (*) 4.0 - 10.5 (K/uL)   RBC 3.77 (*) 3.87 - 5.11 (MIL/uL)   Hemoglobin 11.5 (*) 12.0 - 15.0 (g/dL)   HCT 40.9 (*) 81.1 - 46.0 (%)   MCV 92.6  78.0 - 100.0 (fL)   MCH 30.5  26.0 - 34.0 (pg)   MCHC 33.0  30.0 - 36.0 (g/dL)   RDW 91.4  78.2 - 95.6 (%)   Platelets 306  150 - 400 (K/uL)     Assessment/Plan: Status post Cesarean section. Doing well postoperatively.  Continue current care.  Julia Kulzer H 07/19/2011, 7:17 AM

## 2011-07-19 NOTE — Progress Notes (Signed)
UR chart review completed.  

## 2011-07-20 LAB — CULTURE, BETA STREP (GROUP B ONLY)

## 2011-07-20 LAB — GC/CHLAMYDIA PROBE AMP, GENITAL
Chlamydia, DNA Probe: NEGATIVE
GC Probe Amp, Genital: NEGATIVE

## 2011-07-20 NOTE — Progress Notes (Signed)
2 Days Post-Op Procedure(s) (LRB): CESAREAN SECTION (N/A)  Subjective: Patient reports incisional pain, tolerating PO, + flatus and no problems voiding.    Pt reports 5/10 incisional pain. Relieved to a 2/10 with Ibuprofen. Pt denies BM since c/s but reports flatus. Mild spotting.  Pt denies CP, sob, f/c, n/v, dysuria/pulyria, dizziness, LE pain or edema.  Objective: I have reviewed patient's vital signs, intake and output, medications and labs. WBC elevated at 19.1  General: alert, cooperative and no distress Resp: clear to auscultation bilaterally Cardio: regular rate and rhythm, S1, S2 normal, no murmur, click, rub or gallop Extremities: Homans sign is negative, no sign of DVT and no edema, redness or tenderness in the calves or thighs Vaginal Bleeding: minimal, spotting on pad ABD: distended, soft, BSx4, incision dressing clean, dry, and intact  Assessment: s/p Procedure(s) (LRB): CESAREAN SECTION (N/A): stable, progressing well and tolerating diet  Plan: Continue to encourage ambulation Remove incisional dressing Discharge home tomorrow   Ricardo Jericho 07/20/2011, 8:00 AM  I have seen and examined this patient and I agree with the above. BP 117/83, P 87, R 18, T 98.6; dsg: clean, dry, intact Aleisha Paone 8:32 AM 07/20/2011

## 2011-07-21 MED ORDER — IBUPROFEN 600 MG PO TABS: 600.0000 mg | ORAL_TABLET | Freq: Four times a day (QID) | ORAL | Status: AC

## 2011-07-21 MED ORDER — PRENATAL MULTIVITAMIN CH: 1.0000 | ORAL_TABLET | Freq: Every day | ORAL | Status: AC

## 2011-07-21 MED ORDER — OXYCODONE-ACETAMINOPHEN 5-325 MG PO TABS: 1.0000 | ORAL_TABLET | ORAL | Status: AC | PRN

## 2011-07-21 NOTE — Discharge Summary (Signed)
Obstetric Discharge Summary Reason for Admission: rupture of membranes and preeclampsia at 29 weeks Prenatal Procedures: ultrasound Intrapartum Procedures: cesarean: low cervical, transverse Postpartum Procedures: Magnesium sulfate x 24 hours post delivery Complications-Operative and Postpartum: none Hemoglobin  Date Value Range Status  07/19/2011 11.5* 12.0 - 15.0 g/dL Final     HCT  Date Value Range Status  07/19/2011 34.9* 36.0 - 46.0 % Final    Physical Exam:  General: alert, cooperative and mild distress Lochia: appropriate Uterine Fundus: firm Incision: healing well, no significant drainage, no dehiscence DVT Evaluation: No evidence of DVT seen on physical exam.  Discharge Diagnoses: Preterm delivery at 29 weeks; PLTCS due to breech & cord prolapse; s/p Preeclampsia;   Discharge Information: Date: 07/21/2011 Activity: pelvic rest Diet: routine Medications: PNV, Ibuprofen and Percocet Condition: stable Instructions: refer to practice specific booklet Discharge to: home Follow-up Information    Follow up with [facility]. (Will have postpartum appointment in 6 weeks)    Contact information:   [address] [place] [address]          Newborn Data: Live born female  Birth Weight: 2 lb 11.9 oz (1245 g) APGAR: 4, 7  Infant to remain in NICU. Pt is pumping breastmilk; undecided re contraception currently.  Cam Hai 07/21/2011, 7:24 AM

## 2011-07-21 NOTE — Clinical Social Work Maternal (Signed)
Clinical Social Work Department PSYCHOSOCIAL ASSESSMENT - MATERNAL/CHILD 07/21/2011  Patient:  Maddy,Kambry  Account Number:  [id number]  Admit Date:  07/17/2011  Childs Name:   Mariam Diarra   Clinical Social Worker:  Lindwood Mogel, LCSW   Date/Time:  07/20/2011 02:30 PM  Date Referred:  07/20/2011   Referral source NICU    Referred reason NICU  Other referral source:    I:  FAMILY / HOME ENVIRONMENT Child's legal guardian:  PARENT  Guardian - Name Guardian - Age Guardian - Address Floy Mcqueary [address], [place] [address] Koni Diarra  same  Other household support members/support persons Other support:    II  PSYCHOSOCIAL DATA Information Source:  Patient Interview  Financial and Community Resources Employment:   FOB works at a manufacturing plant in [place]  Financial resources:  Self Pay If Medicaid - County:    School / Grade:   Maternity Care Coordinator / Child Services Coordination / Early Interventions:  Cultural issues impacting care:   Parents primary language is French.  FOB speaks some English.  MOB only speaks French.  MOB lives in [place] and was here visiting FOB when she delivered.   III  STRENGTHS Strengths Adequate Resources Compliance with medical plan  Strength comment:    IV  RISK FACTORS AND CURRENT PROBLEMS Current Problem:  YES   Risk Factor & Current Problem Patient Issue Family Issue Risk Factor / Current Problem Comment Adjustment to Illness Y Y MOB is extremely emotional   V  SOCIAL WORK ASSESSMENT SW met with parents with the assistance of French Interpreter to introduce myself, complete assessment and evaluate how they are coping with baby's premature birth and admission to NICU.  MOB was extremely emotional and FOB was very supportive and comforting to MOB.  She states that she was here visiting FOB and planned to return to [place] on 07/27/11 but had the baby while she was here.  She appears  traumatized by the situation and states that is hard to look at her baby in her condition.  SW validated feelings, discussed signs and symptoms of PPD and common emotions related to a NICU stay.  SW also mentioned the possibility of talking with her doctor about starting an antidepressant to get through this difficult time.  She states she will think about it.  FOB states he is doing okay at this time. They have not made any preparations for baby at home, because they were not planning to live here with the baby. SW asked them to please let SW know of any needs they may have in this area.  SW will let [facility] staff know of the possible need.  Baby does not qualify for SSI because she was 45g over the limit.  Since she met the gestational age criteria and was so close to meeting the weight, SW recommended applying.  They state they want to think about it.  SW is concerned that MOB may not fully understand how it will be until she is able to take baby back to [place] and briefly addressed this, but did not want to talk about things too far in the future.  SW offered to assist with any information being sent to the colsolate in order to show the need for MOB to stay in the [place] until it is safe for her baby to travel.  SW asked if parents would like to set up routine interpreting times in order to ensure   they are getting updates regularly.  FOB speaks English well and states he can ask for an interpreter as needed instead of on a routine.  SW explained support services offered by NICU SW and gave contact information.  SW informed parents that the SSI application is optional and to let SW know if they are interested.     VI SOCIAL WORK PLAN Social Work Plan Psychosocial Support/Ongoing Assessment of Needs  Type of pt/family education:   If child protective services report - county:   If child protective services report - date:   Information/referral to community resources comment:    Other social work plan:      

## 2011-07-21 NOTE — Discharge Instructions (Signed)

## 2011-07-27 ENCOUNTER — Encounter: Payer: Self-pay | Admitting: Family Medicine

## 2011-08-14 NOTE — Anesthesia Postprocedure Evaluation (Signed)
Anesthesia Post Note  Patient: Pamela Salinas  Procedure(s) Performed: Procedure(s) (LRB): CESAREAN SECTION (N/A)  Anesthesia type: Spinal  Patient location: PACU  Post pain: Pain level controlled  Post assessment: Post-op Vital signs reviewed  Last Vitals:  Filed Vitals:   07/21/11 0505  BP: 120/82  Pulse: 91  Temp: 36.9 C  Resp: 20    Post vital signs: Reviewed  Level of consciousness: awake  Complications: No apparent anesthesia complications

## 2011-08-18 ENCOUNTER — Ambulatory Visit (INDEPENDENT_AMBULATORY_CARE_PROVIDER_SITE_OTHER): Payer: Self-pay | Admitting: Family

## 2011-08-18 ENCOUNTER — Encounter: Payer: Self-pay | Admitting: Family

## 2011-08-18 MED ORDER — HYDROCHLOROTHIAZIDE 25 MG PO TABS: 25.0000 mg | ORAL_TABLET | Freq: Every day | ORAL | Status: AC

## 2011-08-18 NOTE — Progress Notes (Unsigned)
Patient ID: Pamela Salinas, female   DOB: 06-15-1975, 36 y.o.   MRN: [id number] Pt states she has H/A today.  She denies visual problems today.

## 2011-08-18 NOTE — Progress Notes (Unsigned)
  Subjective:     Pamela Salinas is a 36 y.o. female who presents for a postpartum visit. She is 4 weeks postpartum following a low cervical transverse Cesarean section. I have fully reviewed the prenatal and intrapartum course. The delivery was at 29 gestational weeks due to PPROM and cord prolapse. Outcome: primary cesarean section, low transverse incision. Anesthesia: spinal. Postpartum course has been unremarkable, just difficult with infant still in NICU, decreased bleeding and pain. Baby's course has been marked by infant remaining in NICU. Baby is feeding by breast. Bleeding no bleeding. Bowel function is normal. Bladder function is normal. Patient is sexually active. Contraception method is none. Postpartum depression screening: negative.  Inquired pt since blood pressure elevated > no reports of headaches, vision changes, or epigastric pain.    The following portions of the patient's history were reviewed and updated as appropriate: allergies, current medications, past family history, past medical history, past social history, past surgical history and problem list.  Review of Systems Pertinent items are noted in HPI.   Objective:    BP 150/109  Pulse 86  Temp 97 F (36.1 C) (Oral)  Resp 20  Ht 5\' 8"  (1.727 m)  Wt 217 lb 3.2 oz (98.521 kg)  BMI 33.03 kg/m2  Breastfeeding? Yes  General:  alert, cooperative and appears stated age   Breasts:  inspection negative, no nipple discharge or bleeding, no masses or nodularity palpable  Lungs: clear to auscultation bilaterally  Heart:  regular rate and rhythm, S1, S2 normal, no murmur, click, rub or gallop  Abdomen: soft, non-tender; bowel sounds normal; no masses,  no organomegaly; incision site no signs of infection   Vulva:  not evaluated  Vagina: not evaluated  Cervix:  Not examined  Corpus: normal size, contour, position, consistency, mobility, non-tender  Adnexa:  normal adnexa  Rectal Exam: Not performed.       Extremities - no  edema  Assessment:    Normal postpartum exam. Pap smear not done at today's visit.  Hypertension  Plan:    1. Contraception: Still deciding; emphasized the possibility of pregnancy. 2. HCTZ 25 mg PO  3. Follow up in: 2 weeks for blood pressure check or as needed.

## 2011-09-01 ENCOUNTER — Ambulatory Visit (INDEPENDENT_AMBULATORY_CARE_PROVIDER_SITE_OTHER): Payer: Self-pay | Admitting: Medical

## 2011-09-01 VITALS — BP 146/99

## 2011-09-01 DIAGNOSIS — IMO0001 Reserved for inherently not codable concepts without codable children: Secondary | ICD-10-CM

## 2011-09-01 DIAGNOSIS — O10019 Pre-existing essential hypertension complicating pregnancy, unspecified trimester: Secondary | ICD-10-CM

## 2011-09-01 NOTE — Progress Notes (Signed)
Patient ID: Pamela Salinas, female   DOB: Aug 17, 1975, 36 y.o.   MRN: [id number]  Patient presents today for a BP check. She was seen on 08/18/11 by Erskine Emery, CNM who started her on HCTZ 25 mg PO daily. She has been taking the medication at night daily. She continues to be hypertensive, however, she denies HA, changes in vision or blurred vision at this time. After discussing her situation with Maylon Cos, CNM, she was instructed to continue on her current dosage of HCTZ but to start to take the pill each morning. She will return in 1 month for another BP check.   The patient also voiced concerns of reflux symptoms. She has tried Tums which have not successfully treated her symptoms. Maylon Cos, CNM, recommended that she try OTC Pepcid 1-2 daily to control her symptoms.   The patient voiced understanding of both recommendations and will return in 1 month for follow-up as recommended.   Judith Blonder

## 2011-09-30 ENCOUNTER — Ambulatory Visit (INDEPENDENT_AMBULATORY_CARE_PROVIDER_SITE_OTHER): Payer: Medicaid Other | Admitting: Obstetrics and Gynecology

## 2011-09-30 VITALS — BP 140/94 | HR 89 | Temp 97.0°F | Ht 67.0 in | Wt 207.2 lb

## 2011-09-30 DIAGNOSIS — I10 Essential (primary) hypertension: Secondary | ICD-10-CM

## 2011-09-30 NOTE — Progress Notes (Signed)
Chief Complaint: Blood Pressure Check   None    SUBJECTIVE HPI: Pamela Salinas is a 36 y.o. G2P0111 who was started postpartum on HCTZ 25 mg po qd 1 mo ago by [facility] and comes for recheck. No PMD.    Past Medical History  Diagnosis Date  . GERD (gastroesophageal reflux disease)   . Hypertension    OB History    Grav Para Term Preterm Abortions TAB SAB Ect Mult Living   2 1 0 1 1 1 0 0 0 1      # Outc Date GA Lbr Len/2nd Wgt Sex Del Anes PTL Lv   1 PRE 6/13 [redacted]w[redacted]d 00:00 2lb11.9oz(1.245kg) F LTCS Spinal  Yes   2 TAB              Past Surgical History  Procedure Date  . Back surgery     cyst removal?  . Breast surgery 2011    ruptured vein rt breast  . Cesarean section 07/18/2011    Procedure: CESAREAN SECTION;  Surgeon: Tereso Newcomer, MD;  Location: [facility];  Service: Gynecology;  Laterality: N/A;  Primary cesarean section with delivery of baby girl at [phone].   History   Social History  . Marital Status: Unknown    Spouse Name: N/A    Number of Children: N/A  . Years of Education: N/A   Occupational History  . Not on file.   Social History Main Topics  . Smoking status: Never Smoker   . Smokeless tobacco: Never Used  . Alcohol Use: No  . Drug Use: No  . Sexually Active: No     unsure about contraception method   Other Topics Concern  . Not on file   Social History Narrative  . No narrative on file   Current Outpatient Prescriptions on File Prior to Visit  Medication Sig Dispense Refill  . hydrochlorothiazide (HYDRODIURIL) 25 MG tablet Take 1 tablet (25 mg total) by mouth daily.  30 tablet  6  . Prenatal Vit-Fe Fumarate-FA (PRENATAL MULTIVITAMIN) TABS Take 1 tablet by mouth daily.  100 tablet  1   No Known Allergies  ROS: Pertinent items in HPI  OBJECTIVE Blood pressure 140/94, pulse 89, temperature 97 F (36.1 C), temperature source Oral, height 5\' 7"  (1.702 m), weight 207 lb 3.2 oz (93.985 kg).  BPs: 154/99, 142/94, 140/94  GENERAL:  Well-developed, well-nourished female in no acute distress.  HEENT: Normocephalic HEART: normal rate RESP: normal effort ABDOMEN: Soft, non-tender EXTREMITIES: Nontender, no edema NEURO: Alert and oriented  ASSESSMENT CHTN BPs much improved on HCTZ  PLAN Discharge home Advised to continue HCTZ as directed and infop given on primary care providers, [facility]. Advised to be seen by 3 months. Handout on [facility] reviewed.   Caren Griffins, CNM  09/30/2011  4:02 PM

## 2011-09-30 NOTE — Patient Instructions (Signed)

## 2013-12-10 ENCOUNTER — Encounter: Payer: Self-pay | Admitting: Family

## 2014-01-17 IMAGING — US US OB COMP +14 WK
1 series · 12 of 26 positions shown · non-contrast
Comparison: none

[Series 1: us ob comp +14 wk · 26 acquisitions, 12 frames shown]
[im 2/26]
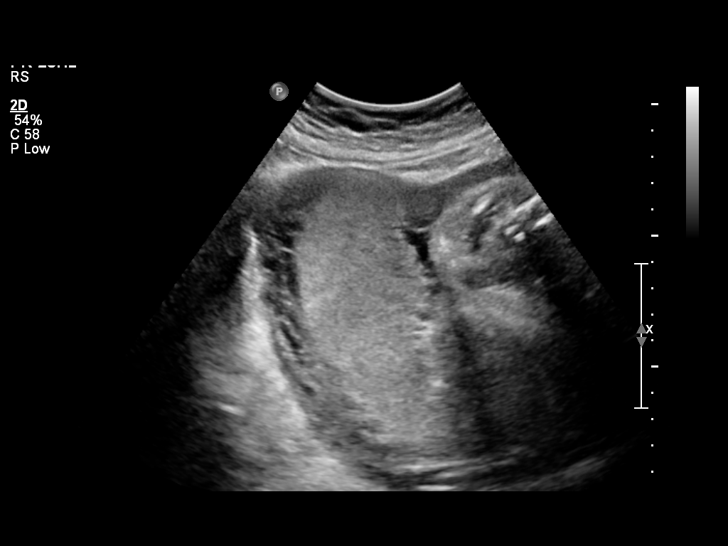
[im 4/26]
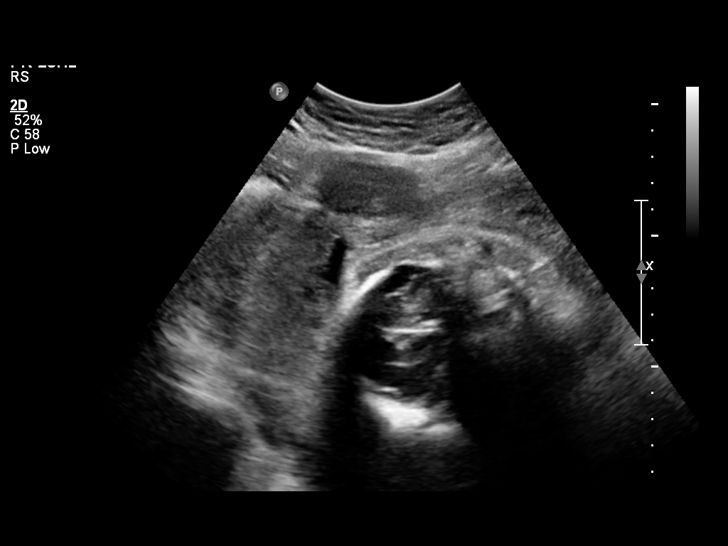
[im 6/26]
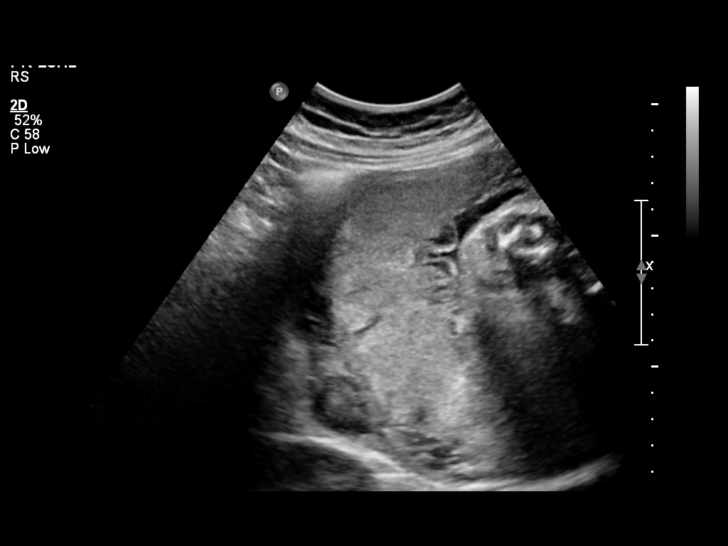
[im 8/26]
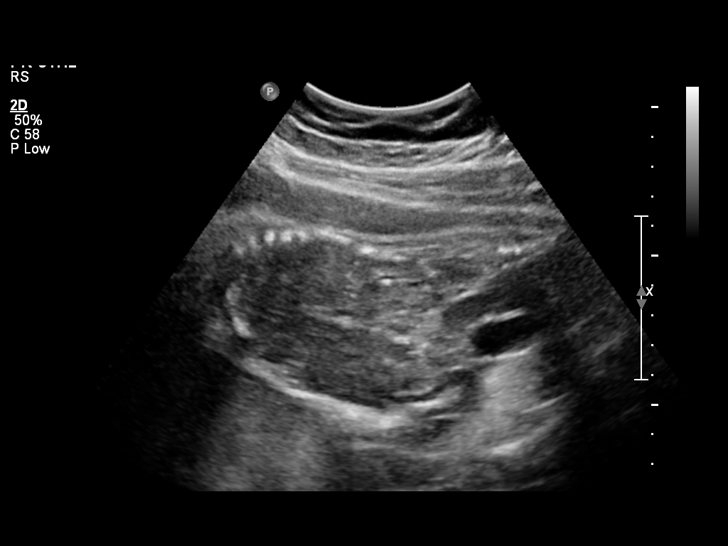
[im 10/26]
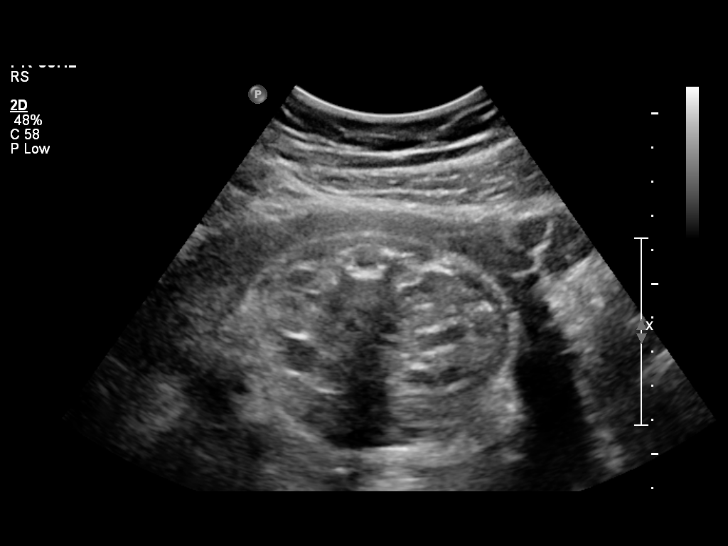
[im 12/26]
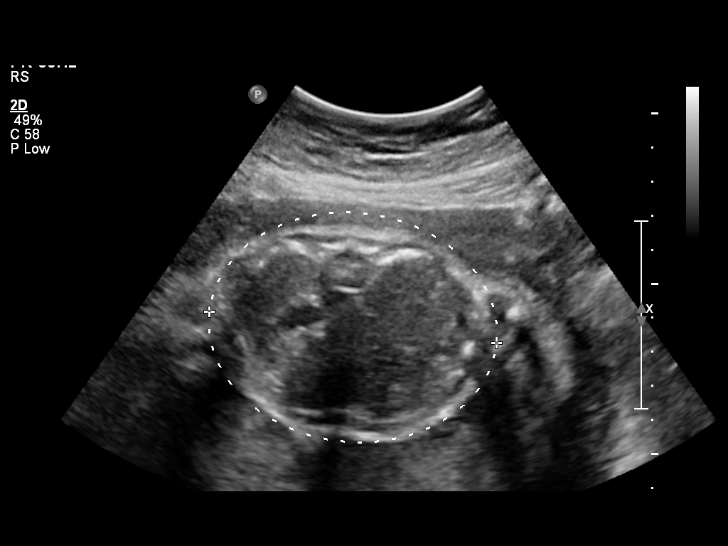
[im 15/26]
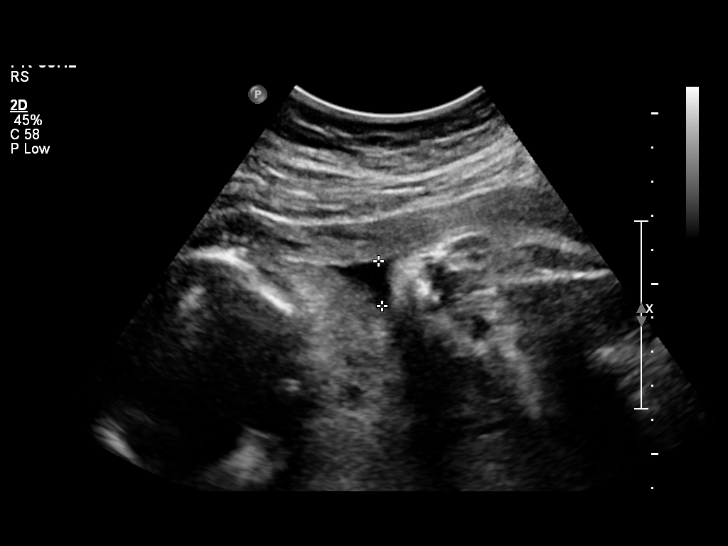
[im 17/26]
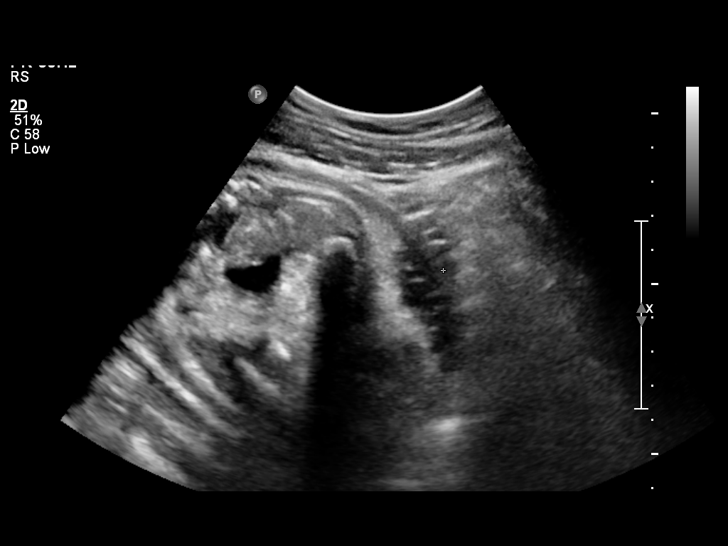
[im 19/26]
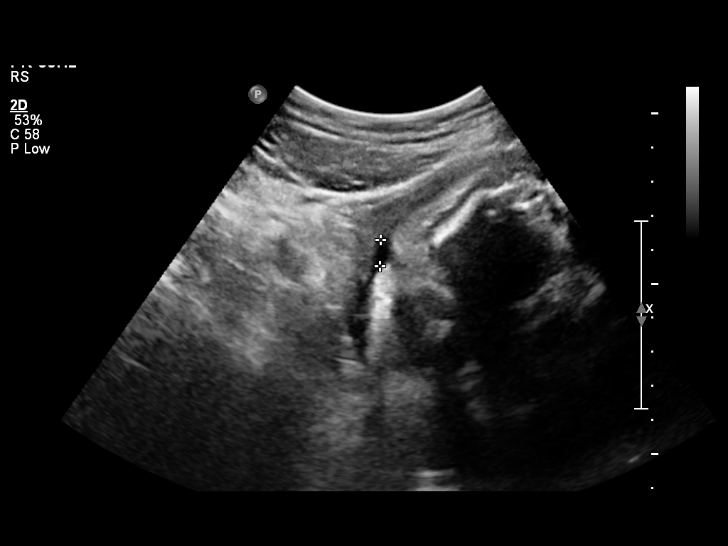
[im 21/26]
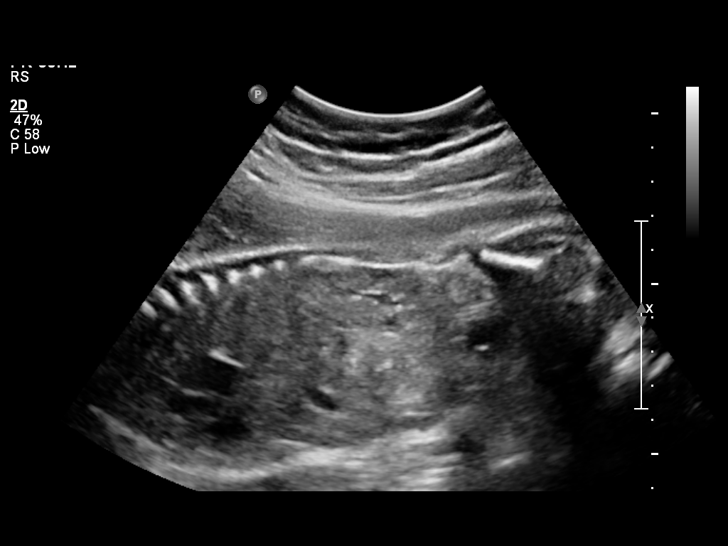
[im 23/26]
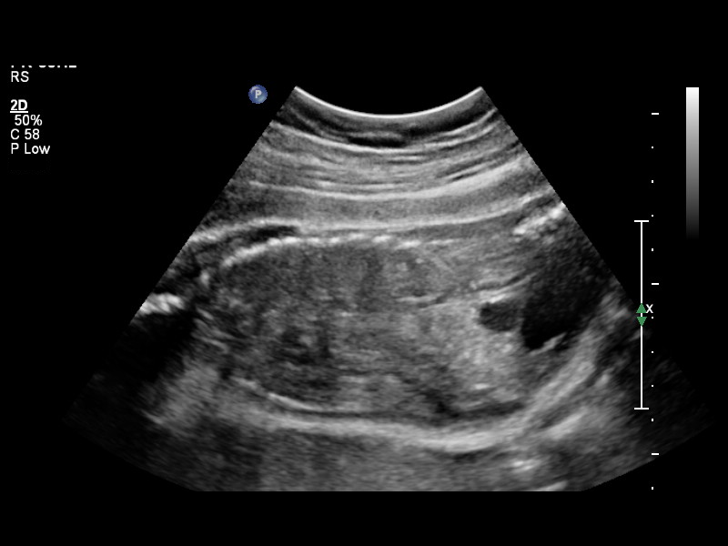
[im 25/26]
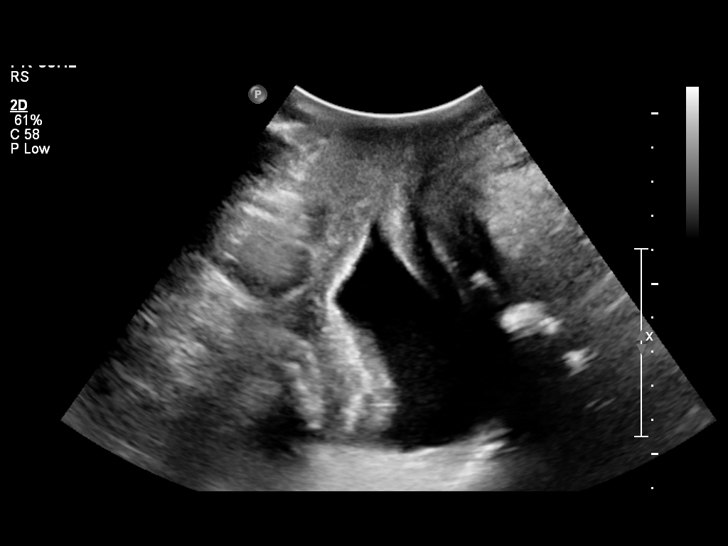

[12 of 26 positions shown; findings below may reference images not displayed]

OBSTETRICS REPORT
                      (Signed Final 07/17/2011 [DATE])

 Order#:         [id number]
                 5_I
Procedures

 US OB COMP + 14 WK                                    76805.1
Indications

 Assess Fetal Growth / Estimated Fetal Weight
 Assess amniotic fluid volume
 Assess fetal well being
 Advanced maternal age (AMA), Multigravida
 Spontaneous  Rupture of Membranes - leaking fluid
Fetal Evaluation

 Fetal Heart Rate:  153                         bpm
 Cardiac Activity:  Observed
 Presentation:      Breech, footling
 Placenta:          Posterior Fundal, above
                    cervical os

 Amniotic Fluid
 AFI FV:      Oligohydramnios
 AFI Sum:     3.31    cm      < 3  %Tile     Larg Pckt:   1.31   cm
 RUQ:   1.23   cm    RLQ:    1.31   cm    LUQ:   0       cm   LLQ:    0.77   cm
Biophysical Evaluation

 Amniotic F.V:   Pocket < 2 cm two          F. Tone:        Observed
                 planes
 F. Movement:    Observed                   Score:          [DATE]
 F. Breathing:   Not Observed
Biometry

 BPD:     69.4  mm    G. Age:   27w 6d                CI:         76.5   70 - 86
 OFD:     90.7  mm                                    FL/HC:      20.9   19.6 -

 HC:       260  mm    G. Age:   28w 2d        5  %    HC/AC:      1.10   0.99 -

 AC:     237.1  mm    G. Age:   28w 0d       15  %    FL/BPD:     78.2   71 - 87
 FL:      54.3  mm    G. Age:   28w 5d       24  %    FL/AC:      22.9   20 - 24
 Est. FW:    1371  gm    2 lb 10 oz      34  %
Gestational Age

 Clinical EDD:  29w 1d                                        EDD:   10/01/11
 U/S Today:     28w 2d                                        EDD:   10/07/11
 Best:          29w 1d    Det. By:   Clinical EDD             EDD:   10/01/11
Anatomy

 Cranium:           Appears Andy       Aortic Arch:       Not well
                                                           visualized
 Fetal Cavum:       Appears normal      Ductal Arch:       Not well
                                                           visualized
 Ventricles:        Not well            Diaphragm:         Not well
                    visualized                             visualized
 Choroid Plexus:    Not well            Stomach:           Appears
                    visualized                             normal, left
                                                           sided
 Cerebellum:        Not well            Abdomen:           Not well
                    visualized                             visualized
 Posterior Fossa:   Not well            Abdominal Wall:    Not well
                    visualized                             visualized
 Nuchal Fold:       Not applicable      Cord Vessels:      Not well
                    (>20 wks GA)                           visualized
 Face:              Not well            Kidneys:           Appear normal
                    visualized
 Heart:             Not well            Bladder:           Appears normal
                    visualized
 RVOT:              Not well            Spine:             Not well
                    visualized                             visualized
 LVOT:              Not well            Limbs:             Not well
                    visualized                             visualized
Cervix Uterus Adnexa

 Cervix:       The cervix is dilated.
Impression

 Single living IUP with assigned GA of 29w 1d. EFW is at the
 34th percentile
 Oligohydramnios. AFI is 3.3 cm.
 BPP is [DATE]. A score of zero was given for low amniotic
 fluid volume and lack of fetal breathing.
 The cervix is dilated. No closed cervical length is identified.
 This report was telephoned by Dr.Ronlor to Araceli, the
 patient's nurse, at [DATE] on 07/17/2011. She was already
 aware of these findings.

## 2018-12-13 ENCOUNTER — Other Ambulatory Visit: Payer: Self-pay

## 2018-12-13 ENCOUNTER — Emergency Department (HOSPITAL_COMMUNITY): Payer: Self-pay

## 2018-12-13 ENCOUNTER — Encounter (HOSPITAL_COMMUNITY): Payer: Self-pay | Admitting: Emergency Medicine

## 2018-12-13 ENCOUNTER — Emergency Department (HOSPITAL_COMMUNITY)
Admission: EM | Admit: 2018-12-13 | Discharge: 2018-12-13 | Disposition: A | Discharge status: Home / Self Care | Payer: Self-pay | Attending: Emergency Medicine | Admitting: Emergency Medicine

## 2018-12-13 DIAGNOSIS — Z79899 Other long term (current) drug therapy: Secondary | ICD-10-CM | POA: Insufficient documentation

## 2018-12-13 DIAGNOSIS — R011 Cardiac murmur, unspecified: Secondary | ICD-10-CM | POA: Insufficient documentation

## 2018-12-13 DIAGNOSIS — R002 Palpitations: Secondary | ICD-10-CM | POA: Insufficient documentation

## 2018-12-13 DIAGNOSIS — I10 Essential (primary) hypertension: Secondary | ICD-10-CM | POA: Insufficient documentation

## 2018-12-13 DIAGNOSIS — R0602 Shortness of breath: Secondary | ICD-10-CM | POA: Insufficient documentation

## 2018-12-13 DIAGNOSIS — R000 Tachycardia, unspecified: Secondary | ICD-10-CM | POA: Insufficient documentation

## 2018-12-13 LAB — BASIC METABOLIC PANEL
Anion gap: 11 (ref 5–15)
BUN: 8 mg/dL (ref 6–20)
CO2: 21 mmol/L — ABNORMAL LOW (ref 22–32)
Calcium: 9.2 mg/dL (ref 8.9–10.3)
Chloride: 107 mmol/L (ref 98–111)
Creatinine, Ser: 0.73 mg/dL (ref 0.44–1.00)
GFR calc Af Amer: >60 mL/min (ref >60)
GFR calc non Af Amer: >60 mL/min (ref >60)
Glucose, Bld: 145 mg/dL — ABNORMAL HIGH (ref 70–99)
Potassium: 3.6 mmol/L (ref 3.5–5.1)
Sodium: 139 mmol/L (ref 135–145)

## 2018-12-13 LAB — TROPONIN I (HIGH SENSITIVITY)
Troponin I (High Sensitivity): <2 ng/L (ref <18)
Troponin I (High Sensitivity): 3 ng/L (ref <18)

## 2018-12-13 LAB — I-STAT BETA HCG BLOOD, ED (MC, WL, AP ONLY): I-stat hCG, quantitative: <5 m[IU]/mL (ref <5)

## 2018-12-13 LAB — CBC
HCT: 40.1 % (ref 36.0–46.0)
Hemoglobin: 13 g/dL (ref 12.0–15.0)
MCH: 29.4 pg (ref 26.0–34.0)
MCHC: 32.4 g/dL (ref 30.0–36.0)
MCV: 90.7 fL (ref 80.0–100.0)
Platelets: 391 10*3/uL (ref 150–400)
RBC: 4.42 MIL/uL (ref 3.87–5.11)
RDW: 14.5 % (ref 11.5–15.5)
WBC: 8.7 10*3/uL (ref 4.0–10.5)
nRBC: 0 % (ref 0.0–0.2)

## 2018-12-13 LAB — TSH: TSH: 0.691 u[IU]/mL (ref 0.350–4.500)

## 2018-12-13 LAB — D-DIMER, QUANTITATIVE: D-Dimer, Quant: 0.27 ug/mL-FEU (ref 0.00–0.50)

## 2018-12-13 MED ORDER — SODIUM CHLORIDE 0.9% FLUSH: 3.0000 mL | Freq: Once | INTRAVENOUS | Status: DC

## 2018-12-13 NOTE — ED Triage Notes (Signed)
Pt woke up with fast HR and SOB at 3am.  Denies fever, chills.  Denies pain.

## 2018-12-13 NOTE — Discharge Instructions (Addendum)
You were seen in the ED for tachycardia and shortness of breath. We got lab work and an EKG of your heart. Your lab work was all normal as was your EKG except for your fast heart rate. We do not think you have a life threatening condition but we do think you need to follow up with cardiology for further workup. They will call you to set up an appointment.   If you develop severe trouble breathing or chest pain or your palpitations become sustained please come back to the ED.

## 2018-12-13 NOTE — ED Notes (Signed)
Patient Alert and oriented to baseline. Stable and ambulatory to baseline. Patient verbalized understanding of the discharge instructions.  Patient belongings were taken by the patient.   

## 2018-12-13 NOTE — ED Provider Notes (Signed)
[facility] EMERGENCY DEPARTMENT Provider Note   CSN: [id number] Arrival date & time: 12/13/18  7371     History   Chief Complaint Chief Complaint  Patient presents with  . Tachycardia    HPI Pamela Salinas is a 43 y.o. female.     Pamela Salinas is a 43 yo F who presents after waking up with palpitations and shortness of breath. She reports the palpitations are intermittent and while they had decreased somewhat since last night, they have now returned. She was noted to have an elevated heart rate at her PCP's office a month ago and was started on metoprolol. She got an EKG which the patient reports was normal and thyroid labs which the patient reports were also normal. She denies excessive caffeine use. She denies lightheadedness and syncope. She denies chest pain, arm pain, jaw pain, back pain, abdominal pain, nv, diarrhea, fever, rhinorrhea, sore throat, cough, blurry vision, urinary problems and falls. She does endorse some chills. She denies sick contacts.     Past Medical History:  Diagnosis Date  . GERD (gastroesophageal reflux disease)   . Hypertension   Prediabetes   Patient Active Problem List   Diagnosis Date Noted  . Essential hypertension 09/30/2011  . Status post cesarean section 07/18/2011  . Hypertension in pregnancy, essential, antepartum 07/17/2011  . Preterm premature rupture of membranes (PPROM) delivered, current hospitalization 07/17/2011    Past Surgical History:  Procedure Laterality Date  . BACK SURGERY     cyst removal?  . BREAST SURGERY  2011   ruptured vein rt breast  . CESAREAN SECTION  07/18/2011   Procedure: CESAREAN SECTION;  Surgeon: Osborne Oman, MD;  Location: [facility];  Service: Gynecology;  Laterality: N/A;  Primary cesarean section with delivery of baby girl at [phone].     OB History    Gravida  2   Para  1   Term  0   Preterm  1   AB  1   Living  1     SAB  0   TAB  1   Ectopic  0   Multiple  0   Live Births  1            Home Medications    Prior to Admission medications   Medication Sig Start Date End Date Taking? Authorizing Provider  hydrochlorothiazide (HYDRODIURIL) 25 MG tablet Take 1 tablet (25 mg total) by mouth daily. 08/18/11 08/17/12  Cyndee Brightly, CNM  Prenatal Vit-Fe Fumarate-FA (PRENATAL MULTIVITAMIN) TABS Take 1 tablet by mouth daily. 07/21/11   Myrtis Ser, CNM    Family History Family History  Problem Relation Age of Onset  . Diabetes Mother   . Hypertension Mother   . Anesthesia problems Neg Hx   . Malignant hyperthermia Neg Hx   . Hypotension Neg Hx   . Pseudochol deficiency Neg Hx    Mother had a heart condition which may have been treated with a defibrillator. Her mother passed away from her heart condition at around 43 years old.  Social History Social History   Tobacco Use  . Smoking status: Never Smoker  . Smokeless tobacco: Never Used  Substance Use Topics  . Alcohol use: No  . Drug use: No   Lives at home with her boyfriend  Does not smoke, drink alcohol or drink caffiene Runs for exercise regularly   Allergies   Patient has no known allergies.   Review of Systems Review of Systems  Constitutional: Negative for fever.  HENT: Negative for rhinorrhea and sore throat.   Eyes: Negative for visual disturbance.  Respiratory: Positive for shortness of breath. Negative for cough.   Cardiovascular: Positive for palpitations. Negative for chest pain.  Gastrointestinal: Negative for abdominal pain, diarrhea, nausea and vomiting.  Endocrine: Negative for heat intolerance.  Genitourinary: Negative for difficulty urinating.  Skin: Negative for rash.  Neurological: Negative for syncope and light-headedness.   Physical Exam Updated Vital Signs BP 131/83   Pulse 95   Temp 98.4 F (36.9 C) (Oral)   Resp 15   LMP 12/01/2018   SpO2 100%   Physical Exam Vitals signs and nursing note reviewed.  Constitutional:       General: She is not in acute distress.    Appearance: Normal appearance.  HENT:     Head: Normocephalic and atraumatic.  Neck:     Musculoskeletal: Normal range of motion.  Cardiovascular:     Rate and Rhythm: Tachycardia present.     Heart sounds: Murmur (systolic ejection murmur) present.  Pulmonary:     Effort: Pulmonary effort is normal. No respiratory distress.  Abdominal:     General: There is no distension.     Tenderness: There is no abdominal tenderness.  Musculoskeletal: Normal range of motion.        General: No swelling or tenderness.  Skin:    General: Skin is warm and dry.     Findings: No rash.  Neurological:     General: No focal deficit present.     Mental Status: She is alert and oriented to person, place, and time.  Psychiatric:        Mood and Affect: Mood normal.    ED Treatments / Results  Labs (all labs ordered are listed, but only abnormal results are displayed) Labs Reviewed  BASIC METABOLIC PANEL - Abnormal; Notable for the following components:      Result Value   CO2 21 (*)    Glucose, Bld 145 (*)    All other components within normal limits  CBC  TSH  D-DIMER, QUANTITATIVE (NOT AT [facility])  I-STAT BETA HCG BLOOD, ED ([facility], [facility], [facility] ONLY)  TROPONIN I (HIGH SENSITIVITY)  TROPONIN I (HIGH SENSITIVITY)    EKG EKG Interpretation  Date/Time:  Wednesday December 13 2018 12:06:36 EST Ventricular Rate:  117 PR Interval:    QRS Duration: 91 QT Interval:  329 QTC Calculation: 459 R Axis:   73 Text Interpretation: Sinus tachycardia Ventricular premature complex Aberrant complex Probable left atrial enlargement Borderline T wave abnormalities No previous ECGs available Confirmed by Frederick PeersLittle, Rachel [phone]([id number]) on 12/13/2018 2:18:46 PM   Radiology Dg Chest 2 View  Result Date: 12/13/2018 CLINICAL DATA:  Shortness of breath and tachycardia EXAM: CHEST - 2 VIEW COMPARISON:  None. FINDINGS: Lungs are clear. Heart size and pulmonary vascularity are normal. No  adenopathy. No bone lesions. No pneumothorax. IMPRESSION: No edema or consolidation.  Cardiac silhouette within normal limits. Electronically Signed   By: Bretta BangWilliam  Woodruff III M.D.   On: 12/13/2018 08:56    Procedures Procedures (including critical care time)  Medications Ordered in ED Medications  sodium chloride flush (NS) 0.9 % injection 3 mL (has no administration in time range)     Initial Impression / Assessment and Plan / ED Course  I have reviewed the triage vital signs and the nursing notes.  Pertinent labs & imaging results that were available during my care of the patient were reviewed by me and  considered in my medical decision making (see chart for details).        Ms. Guidry is our 43 yo F who presents with intermittent palpitations and shortness of breath which woke her up from sleep last night found to be satting well on room air but tachycardic to the 130s with a 4/6 systolic ejection murmur and a family hx of a fatal heart disorder in her mother who is being worked up for ACS, PE, structural heart disease, heart arhythmia and thyroid disease.   ACS less likely given unremarkable EKG (besides tachycardia) and a normal troponin.  No clear risk factors for PE and pregnancy test negative but given tachycardia and SOB getting D-Dimer to further evaluate.   Structural heart disease possible given murmur but no worsening of symptoms with exertion or trouble with exercise to suggest severe AS. Pending PE workup patient will need cardiology follow up with ECHO.  Palpitations could be secondary to an intermittent arhythmia for which she will need quick outpatient follow up with cardiology. Plan to discuss with cardiology before discharge as long as all other workup unremarkable.   Palpitations may also be secondary to thyroid disease. Outpatient TSH slightly low with normal T4. No concern for thyroid storm as patient afebrile with normal BP.  Final Clinical Impressions(s) /  ED Diagnoses   Final diagnoses:  Tachycardia  Palpitations  Murmur, cardiac   D-Dimer normal TSH normal Repeat troponin normal  Patient needs follow up cardiology for further work up of tachycardia, palpitations and murmur.   ED Discharge Orders    None       Jenell Milliner, MD 12/13/18 1540    Little, Ambrose Finland, MD 12/17/18 Windell Moment

## 2018-12-15 ENCOUNTER — Ambulatory Visit: Payer: Self-pay | Admitting: Cardiology
